# Patient Record
Sex: Female | Born: 1945 | Race: White | Marital: Single | Smoking: Never smoker
Health system: Southern US, Community
[De-identification: ages and names within clinical notes are randomized; demographics above are authoritative.]

## PROBLEM LIST (undated history)

## (undated) DIAGNOSIS — M12811 Other specific arthropathies, not elsewhere classified, right shoulder: Secondary | ICD-10-CM

## (undated) DIAGNOSIS — M25511 Pain in right shoulder: Secondary | ICD-10-CM

## (undated) DIAGNOSIS — M1990 Unspecified osteoarthritis, unspecified site: Secondary | ICD-10-CM

## (undated) DIAGNOSIS — J45909 Unspecified asthma, uncomplicated: Secondary | ICD-10-CM

## (undated) DIAGNOSIS — M75100 Unspecified rotator cuff tear or rupture of unspecified shoulder, not specified as traumatic: Secondary | ICD-10-CM

## (undated) DIAGNOSIS — I10 Essential (primary) hypertension: Secondary | ICD-10-CM

## (undated) DIAGNOSIS — G8929 Other chronic pain: Secondary | ICD-10-CM

## (undated) DIAGNOSIS — S4991XA Unspecified injury of right shoulder and upper arm, initial encounter: Secondary | ICD-10-CM

## (undated) HISTORY — PX: CHOLECYSTECTOMY: SHX55

## (undated) HISTORY — PX: ABDOMINAL HYSTERECTOMY: SHX81

---

## 2002-03-08 HISTORY — PX: ROTATOR CUFF REPAIR: SHX139

## 2017-11-06 DIAGNOSIS — S4991XA Unspecified injury of right shoulder and upper arm, initial encounter: Secondary | ICD-10-CM

## 2017-11-06 HISTORY — DX: Unspecified injury of right shoulder and upper arm, initial encounter: S49.91XA

## 2018-01-19 ENCOUNTER — Ambulatory Visit (HOSPITAL_COMMUNITY)
Admission: RE | Admit: 2018-01-19 | Discharge: 2018-01-19 | Disposition: A | Discharge status: Home / Self Care | Payer: Medicare Other | Source: Ambulatory Visit | Attending: Adult Health | Admitting: Adult Health

## 2018-01-19 ENCOUNTER — Other Ambulatory Visit (HOSPITAL_COMMUNITY): Payer: Self-pay | Admitting: General Practice

## 2018-01-19 DIAGNOSIS — M25511 Pain in right shoulder: Secondary | ICD-10-CM

## 2018-01-19 DIAGNOSIS — M19011 Primary osteoarthritis, right shoulder: Secondary | ICD-10-CM | POA: Insufficient documentation

## 2018-01-28 ENCOUNTER — Emergency Department (HOSPITAL_COMMUNITY)
Admission: EM | Admit: 2018-01-28 | Discharge: 2018-01-28 | Disposition: A | Discharge status: Home / Self Care | Payer: Medicare Other | Attending: Emergency Medicine | Admitting: Emergency Medicine

## 2018-01-28 ENCOUNTER — Encounter (HOSPITAL_COMMUNITY): Payer: Self-pay | Admitting: Emergency Medicine

## 2018-01-28 ENCOUNTER — Other Ambulatory Visit: Payer: Self-pay

## 2018-01-28 DIAGNOSIS — J45909 Unspecified asthma, uncomplicated: Secondary | ICD-10-CM | POA: Insufficient documentation

## 2018-01-28 DIAGNOSIS — I10 Essential (primary) hypertension: Secondary | ICD-10-CM | POA: Diagnosis not present

## 2018-01-28 DIAGNOSIS — M19011 Primary osteoarthritis, right shoulder: Secondary | ICD-10-CM | POA: Diagnosis not present

## 2018-01-28 DIAGNOSIS — Z79899 Other long term (current) drug therapy: Secondary | ICD-10-CM | POA: Insufficient documentation

## 2018-01-28 DIAGNOSIS — M25511 Pain in right shoulder: Secondary | ICD-10-CM | POA: Diagnosis present

## 2018-01-28 HISTORY — DX: Unspecified osteoarthritis, unspecified site: M19.90

## 2018-01-28 HISTORY — DX: Essential (primary) hypertension: I10

## 2018-01-28 HISTORY — DX: Unspecified asthma, uncomplicated: J45.909

## 2018-01-28 MED ORDER — TRAMADOL HCL 50 MG PO TABS: ORAL_TABLET | ORAL | 0 refills | Status: DC

## 2018-01-28 MED ORDER — DEXAMETHASONE SODIUM PHOSPHATE 10 MG/ML IJ SOLN
10.0000 mg | Freq: Once | INTRAMUSCULAR | Status: AC
  Administered 2018-01-28: 10 mg via INTRAMUSCULAR
  Filled 2018-01-28: qty 1

## 2018-01-28 NOTE — ED Provider Notes (Signed)
[facility] EMERGENCY DEPARTMENT Provider Note   CSN: [id number] Arrival date & time: 01/28/18  1256     History   Chief Complaint Chief Complaint  Patient presents with  . Shoulder Pain    HPI Veronica Stark is a 72 y.o. female.  Patient is a 72 year old female who presents to the emergency department with a complaint of right shoulder pain.  The patient states that several years ago she had a rotator cuff repair.  Approximately a month ago she injured her shoulder by reaching in the back of her car to get an object.  She had a pop and pain sensation.  She is been seen by her primary physician, has been given some steroid medication and an appointment is being made for her to see an orthopedic specialist.  Patient states that the pain is getting worse instead of better and at times it seems to go up from her shoulder into her neck area.  She was concerned because the pain was getting worse instead of better.  It is also noted that the patient was previously seeing a pain management specialist, and is not seeing a pain management specialist at this time.  She request evaluation and management of her issues.  The history is provided by the patient.    Past Medical History:  Diagnosis Date  . Arthritis   . Asthma   . Hypertension     There are no active problems to display for this patient.   Past Surgical History:  Procedure Laterality Date  . ABDOMINAL HYSTERECTOMY    . CHOLECYSTECTOMY    . ROTATOR CUFF REPAIR Right      OB History   None      Home Medications    Prior to Admission medications   Medication Sig Start Date End Date Taking? Authorizing Provider  ATORVASTATIN CALCIUM PO Take by mouth.   Yes [provider]  Celecoxib (CELEBREX PO) Take by mouth.   Yes [provider]  levothyroxine (SYNTHROID, LEVOTHROID) 75 MCG tablet Take 75 mcg by mouth daily before breakfast.   Yes [provider]  LISINOPRIL PO Take by mouth.    Yes [provider]    Family History No family history on file.  Social History Social History   Tobacco Use  . Smoking status: Never Smoker  . Smokeless tobacco: Never Used  Substance Use Topics  . Alcohol use: Not Currently  . Drug use: Never     Allergies   Patient has no known allergies.   Review of Systems Review of Systems  Constitutional: Negative for activity change.       All ROS Neg except as noted in HPI  HENT: Negative for nosebleeds.   Eyes: Negative for photophobia and discharge.  Respiratory: Negative for cough, shortness of breath and wheezing.   Cardiovascular: Negative for chest pain and palpitations.  Gastrointestinal: Negative for abdominal pain and blood in stool.  Genitourinary: Negative for dysuria, frequency and hematuria.  Musculoskeletal: Positive for arthralgias. Negative for back pain and neck pain.  Skin: Negative.   Neurological: Negative for dizziness, seizures and speech difficulty.  Psychiatric/Behavioral: Negative for confusion and hallucinations.     Physical Exam Updated Vital Signs BP (!) 202/85 (BP Location: Left Arm)   Pulse 75   Temp (!) 97.5 F (36.4 C) (Oral)   Resp 18   Ht 5\' 2"  (1.575 m)   Wt 66.7 kg   SpO2 100%   BMI 26.89 kg/m  Physical Exam  Constitutional: She is oriented to person, place, and time. She appears well-developed and well-nourished.  Non-toxic appearance.  HENT:  Head: Normocephalic.  Right Ear: Tympanic membrane and external ear normal.  Left Ear: Tympanic membrane and external ear normal.  Eyes: Pupils are equal, round, and reactive to light. EOM and lids are normal.  Neck: Normal range of motion. Neck supple. Carotid bruit is not present.  Cardiovascular: Normal rate, regular rhythm, normal heart sounds, intact distal pulses and normal pulses.  Pulmonary/Chest: Breath sounds normal. No respiratory distress.  Abdominal: Soft. Bowel sounds are normal. There is no tenderness. There is  no guarding.  Musculoskeletal: Normal range of motion.  There is a resolving bruise at the [facility] joint portion of the right shoulder.  There is pain with attempted range of motion of the shoulder.  There is pain with palpation just above the clavicle.  The scapula is nondisplaced.  The brachial and radial pulses are 2+.  There is no deformity or evidence of any dislocation or fracture at this time.  Lymphadenopathy:       Head (right side): No submandibular adenopathy present.       Head (left side): No submandibular adenopathy present.    She has no cervical adenopathy.  Neurological: She is alert and oriented to person, place, and time. She has normal strength. No cranial nerve deficit or sensory deficit.  Skin: Skin is warm and dry.  Psychiatric: She has a normal mood and affect. Her speech is normal.  Nursing note and vitals reviewed.    ED Treatments / Results  Labs (all labs ordered are listed, but only abnormal results are displayed) Labs Reviewed - No data to display  EKG None  Radiology No results found.  Procedures Procedures (including critical care time)  Medications Ordered in ED Medications - No data to display   Initial Impression / Assessment and Plan / ED Course  I have reviewed the triage vital signs and the nursing notes.  Pertinent labs & imaging results that were available during my care of the patient were reviewed by me and considered in my medical decision making (see chart for details).  Clinical Course as of Jan 28 1525  Sat Jan 28, 2018  1455 Female complaining of right shoulder pain is been going on for a few months.  She reaggravated it reaching behind her for something and since then she has had pain.  She is newly moved here from [place] and does not have an orthopedic doctor or pain management doctor.  More recently there is been some new bruising over the anterior part of the shoulder.  It is diffusely tender.  She had x-rays just a few days ago  that were negative ordered by her new PCP.  She likely has some tear in her rotator cuff and will need to follow-up with orthopedics.   [MB]    Clinical Course User Index [MB] Terrilee FilesButler, Michael C, MD      Final Clinical Impressions(s) / ED Diagnoses MDM  Patient seen with me by Dr. Charm BargesButler.  Vital signs reviewed.  Blood pressure is elevated at 169/89, otherwise within normal limits.  No neurovascular deficits appreciated on the examination at this time.  The examination supports exacerbation of the arthritis changes and the chronic pain from previous rotator cuff repair.  No neurovascular deficits noted.  Patient is fitted with a sling, and is asked to continue her current medications.  Patient given an injection of Decadron here in  the emergency department.  Prescription for Ultram given to the patient to use every 6 hours.  Patient is to follow-up with orthopedics as previously ordered and suggested.   Final diagnoses:  Primary osteoarthritis of right shoulder    ED Discharge Orders         Ordered    traMADol (ULTRAM) 50 MG tablet     01/28/18 1523           Ivery Quale, PA-C 01/28/18 1532    Terrilee Files, MD 01/28/18 1739

## 2018-01-28 NOTE — Discharge Instructions (Addendum)
Your vital signs are within normal limits with exception of your blood pressure being slightly elevated at 169/89.  I have reviewed your previous x-ray.  It shows arthritis changes, but no fracture, no dislocation.  Your examination suggest exacerbation of arthritis.  No neurologic or vascular changes appreciated on today's examination.  Please use the sling to assist with your discomfort.  Please continue your current medications.  Please add Ultram every 6 hours if needed for more severe pain.  Please keep your appointment with the orthopedic specialist as scheduled.

## 2018-01-28 NOTE — ED Triage Notes (Signed)
Pt report she injured her right should about a month ago, went to see her MD was was getting referred to ortho but hasn't been there yet. Taking prednisone and aleve OTC with mild relief.

## 2018-01-30 ENCOUNTER — Encounter: Payer: Self-pay | Admitting: Gastroenterology

## 2018-02-07 ENCOUNTER — Ambulatory Visit (INDEPENDENT_AMBULATORY_CARE_PROVIDER_SITE_OTHER): Payer: Medicare Other | Admitting: Orthopaedic Surgery

## 2018-02-07 ENCOUNTER — Encounter: Payer: Self-pay | Admitting: Orthopaedic Surgery

## 2018-02-07 VITALS — BP 183/103 | HR 83 | Ht 64.0 in | Wt 148.0 lb

## 2018-02-07 DIAGNOSIS — M25511 Pain in right shoulder: Secondary | ICD-10-CM

## 2018-02-07 DIAGNOSIS — G8929 Other chronic pain: Secondary | ICD-10-CM | POA: Diagnosis not present

## 2018-02-07 MED ORDER — HYDROCODONE-ACETAMINOPHEN 5-325 MG PO TABS: ORAL_TABLET | ORAL | 0 refills | Status: DC

## 2018-02-07 NOTE — Addendum Note (Signed)
Addended by: Baird KayUGLAS, Sitlaly Gudiel M on: 02/07/2018 02:06 PM   Modules accepted: Orders

## 2018-02-07 NOTE — Progress Notes (Signed)
Subjective:    Patient ID: Veronica Stark, female    DOB: 09-29-1945, 72 y.o.   MRN: [id number]  HPI Three months ago she reached to the back seat of her car to grab a charger for her phone and felt pain in the right shoulder.  It has been hurting since then.  She has had increasing pain and tenderness in the shoulder.  She has no direct trauma.  She developed swelling and bruising of the right shoulder about three weeks ago and it has not gone away.  She has seen her local doctor here several times.  She has been to the ER.  I have reviewed the notes from the ER and her doctor.  I have reviewed the x-rays.  She had surgery on the right shoulder in 2004 for rotator cuff repair (two anchors are present) and did well until three months ago.  She has no redness, no numbness.  She has pain sleeping and using her right arm.  She has been on Tramadol which did not help much.   Review of Systems  Constitutional: Positive for activity change.  Respiratory: Positive for shortness of breath. Negative for cough.   Musculoskeletal: Positive for arthralgias and joint swelling.  All other systems reviewed and are negative.  For Review of Systems, all other systems reviewed and are negative.  The following is a summary of the past history medically, past history surgically, known current medicines, social history and family history.  This information is gathered electronically by the computer from prior information and documentation.  I review this each visit and have found including this information at this point in the chart is beneficial and informative.   Past Medical History:  Diagnosis Date  . Arthritis   . Asthma   . Hypertension     Past Surgical History:  Procedure Laterality Date  . ABDOMINAL HYSTERECTOMY    . CHOLECYSTECTOMY    . ROTATOR CUFF REPAIR Right     Current Outpatient Medications on File Prior to Visit  Medication Sig Dispense Refill  . ATORVASTATIN CALCIUM PO Take by  mouth.    . Celecoxib (CELEBREX PO) Take by mouth.    . levothyroxine (SYNTHROID, LEVOTHROID) 75 MCG tablet Take 75 mcg by mouth daily before breakfast.    . LISINOPRIL PO Take by mouth.    . traMADol (ULTRAM) 50 MG tablet 1 or 2 po q6h prn pain 12 tablet 0   No current facility-administered medications on file prior to visit.     Social History   Socioeconomic History  . Marital status: Single    Spouse name: Not on file  . Number of children: Not on file  . Years of education: Not on file  . Highest education level: Not on file  Occupational History  . Not on file  Social Needs  . Financial resource strain: Not on file  . Food insecurity:    Worry: Not on file    Inability: Not on file  . Transportation needs:    Medical: Not on file    Non-medical: Not on file  Tobacco Use  . Smoking status: Never Smoker  . Smokeless tobacco: Never Used  Substance and Sexual Activity  . Alcohol use: Not Currently  . Drug use: Never  . Sexual activity: Not on file  Lifestyle  . Physical activity:    Days per week: Not on file    Minutes per session: Not on file  . Stress: Not on file  Relationships  . Social connections:    Talks on phone: Not on file    Gets together: Not on file    Attends religious service: Not on file    Active member of club or organization: Not on file    Attends meetings of clubs or organizations: Not on file    Relationship status: Not on file  . Intimate partner violence:    Fear of current or ex partner: Not on file    Emotionally abused: Not on file    Physically abused: Not on file    Forced sexual activity: Not on file  Other Topics Concern  . Not on file  Social History Narrative  . Not on file    Family History  Problem Relation Age of Onset  . Kidney disease Mother   . Heart disease Maternal Grandfather     BP (!) 183/103   Pulse 83   Ht 5\' 4"  (1.626 m)   Wt 148 lb (67.1 kg)   BMI 25.40 kg/m   Body mass index is 25.4 kg/m.      Objective:   Physical Exam  Constitutional: She is oriented to person, place, and time. She appears well-developed and well-nourished.  HENT:  Head: Normocephalic and atraumatic.  Eyes: Pupils are equal, round, and reactive to light. Conjunctivae and EOM are normal.  Neck: Normal range of motion. Neck supple.  Cardiovascular: Normal rate, regular rhythm and intact distal pulses.  Pulmonary/Chest: Effort normal.  Abdominal: Soft.  Musculoskeletal:       Arms: Neurological: She is alert and oriented to person, place, and time. She has normal reflexes. She displays normal reflexes. No cranial nerve deficit. She exhibits normal muscle tone. Coordination normal.  Skin: Skin is warm and dry.  Psychiatric: She has a normal mood and affect. Her behavior is normal. Judgment and thought content normal.          Assessment & Plan:   Encounter Diagnosis  Name Primary?  . Chronic right shoulder pain Yes   I will order a MRI of the right shoulder. I feel she has a new tear.  Return after the MRI.  I will give pain medicine.  I have reviewed the [place] Controlled Substance Reporting System web site prior to prescribing narcotic medicine for this patient.   Electronically Signed Darreld McleanWayne Tenise Stetler, MD 12/3/20192:04 PM

## 2018-02-15 ENCOUNTER — Ambulatory Visit (HOSPITAL_COMMUNITY)
Admission: RE | Admit: 2018-02-15 | Discharge: 2018-02-15 | Disposition: A | Discharge status: Home / Self Care | Payer: Medicare Other | Source: Ambulatory Visit | Attending: Orthopaedic Surgery | Admitting: Orthopaedic Surgery

## 2018-02-15 DIAGNOSIS — M25411 Effusion, right shoulder: Secondary | ICD-10-CM | POA: Insufficient documentation

## 2018-02-15 DIAGNOSIS — G8929 Other chronic pain: Secondary | ICD-10-CM | POA: Diagnosis not present

## 2018-02-15 DIAGNOSIS — M25511 Pain in right shoulder: Secondary | ICD-10-CM | POA: Insufficient documentation

## 2018-02-15 DIAGNOSIS — M75121 Complete rotator cuff tear or rupture of right shoulder, not specified as traumatic: Secondary | ICD-10-CM | POA: Diagnosis not present

## 2018-02-15 DIAGNOSIS — Z9889 Other specified postprocedural states: Secondary | ICD-10-CM | POA: Diagnosis not present

## 2018-02-16 ENCOUNTER — Ambulatory Visit (INDEPENDENT_AMBULATORY_CARE_PROVIDER_SITE_OTHER): Payer: Medicare Other | Admitting: Orthopaedic Surgery

## 2018-02-16 ENCOUNTER — Encounter: Payer: Self-pay | Admitting: Orthopaedic Surgery

## 2018-02-16 ENCOUNTER — Telehealth: Payer: Self-pay | Admitting: Orthopaedic Surgery

## 2018-02-16 ENCOUNTER — Telehealth: Payer: Self-pay | Admitting: Radiology

## 2018-02-16 VITALS — BP 163/82 | HR 76 | Ht 64.0 in | Wt 148.0 lb

## 2018-02-16 DIAGNOSIS — M25511 Pain in right shoulder: Secondary | ICD-10-CM | POA: Diagnosis not present

## 2018-02-16 DIAGNOSIS — G8929 Other chronic pain: Secondary | ICD-10-CM

## 2018-02-16 MED ORDER — HYDROCODONE-ACETAMINOPHEN 5-325 MG PO TABS: ORAL_TABLET | ORAL | 0 refills | Status: DC

## 2018-02-16 NOTE — Telephone Encounter (Signed)
-----   Message from Vickki HearingStanley E Harrison, MD sent at 02/16/2018  2:05 PM EST ----- Regarding: RE: patient ? Dr. August Saucerean is much better equipped to handle this then I am looks like a potential superior capsular reconstruction or reverse shoulder replacement ----- Message ----- From: Caffie DammeLittrell, Amy W, RT Sent: 02/16/2018   9:23 AM EST To: Vickki HearingStanley E Harrison, MD Subject: patient ?                                       The patient is status post rotator cuff repair. The supraspinatus is completely torn and retracted 2.5-3.5 cm with mild to moderate atrophy of both the supraspinatus and infraspinatus muscle bellies.  Complete tear of the long head of biceps from the superior labrum versus biceps tenotomy. Extensive fluid is seen tracking into the biceps tendon sheath anterior to the humerus.  Large glenohumeral joint effusion with findings compatible with nodular synovitis in the joint. Largest single focus of synovium is seen in the subscapularis recess anterior to the subscapularis muscle and tendon.  Status post acromioplasty and debridement of the [facility] joint.  Large subcutaneous fluid collection over the [facility] joint and acromion likely represents extension of joint fluid through the patient's rotator cuff tear and acromioclavicular joint into the subcutaneous soft tissues but could be a ganglion or sebaceous cyst.Is this something you can take care of, or would you like for Dr Hilda LiasKeeling to [facility] [place] August Saucerean?

## 2018-02-16 NOTE — Telephone Encounter (Signed)
Hydrocodone-Acetaminophen  5/325 mg  Qty 30 Tablets  PATIENT USES [facility] [place]

## 2018-02-16 NOTE — Telephone Encounter (Signed)
Called patient to advise referral sent to Dr August Saucerean and they will call her with appointment

## 2018-02-16 NOTE — Progress Notes (Signed)
Patient Veronica Stark, female DOB:1945-04-27, 72 y.o. AVW:[id number]  Chief Complaint  Patient presents with  . Shoulder Pain    right   . Results    review MRI    HPI  Veronica Stark is a 72 y.o. female who has right shoulder pain.  She had a MRI which showed: IMPRESSION: Motion degraded examination.  The patient is status post rotator cuff repair. The supraspinatus is completely torn and retracted 2.5-3.5 cm with mild to moderate atrophy of both the supraspinatus and infraspinatus muscle bellies.  Complete tear of the long head of biceps from the superior labrum versus biceps tenotomy. Extensive fluid is seen tracking into the biceps tendon sheath anterior to the humerus.  Large glenohumeral joint effusion with findings compatible with nodular synovitis in the joint. Largest single focus of synovium is seen in the subscapularis recess anterior to the subscapularis muscle and tendon.  Status post acromioplasty and debridement of the [facility] joint.  Large subcutaneous fluid collection over the [facility] joint and acromion likely represents extension of joint fluid through the patient's rotator cuff tear and acromioclavicular joint into the subcutaneous soft tissues but could be a ganglion or sebaceous cyst.  I have explained the findings to her.  I told her I do not do surgery anymore.  I will refer her for surgery.   Body mass index is 25.4 kg/m.  ROS  Review of Systems  Constitutional: Positive for activity change.  Respiratory: Positive for shortness of breath. Negative for cough.   Musculoskeletal: Positive for arthralgias and joint swelling.  All other systems reviewed and are negative.   All other systems reviewed and are negative.  The following is a summary of the past history medically, past history surgically, known current medicines, social history and family history.  This information is gathered electronically by the computer from prior information  and documentation.  I review this each visit and have found including this information at this point in the chart is beneficial and informative.    Past Medical History:  Diagnosis Date  . Arthritis   . Asthma   . Hypertension     Past Surgical History:  Procedure Laterality Date  . ABDOMINAL HYSTERECTOMY    . CHOLECYSTECTOMY    . ROTATOR CUFF REPAIR Right     Family History  Problem Relation Age of Onset  . Kidney disease Mother   . Heart disease Maternal Grandfather     Social History Social History   Tobacco Use  . Smoking status: Never Smoker  . Smokeless tobacco: Never Used  Substance Use Topics  . Alcohol use: Not Currently  . Drug use: Never    No Known Allergies  Current Outpatient Medications  Medication Sig Dispense Refill  . atorvastatin (LIPITOR) 20 MG tablet Take 20 mg by mouth every evening.  0  . celecoxib (CELEBREX) 50 MG capsule     . HYDROcodone-acetaminophen (NORCO/VICODIN) 5-325 MG tablet One tablet every four hours for pain. 30 tablet 0  . levothyroxine (SYNTHROID, LEVOTHROID) 75 MCG tablet Take 75 mcg by mouth daily before breakfast.    . LISINOPRIL PO Take by mouth.    . leflunomide (ARAVA) 20 MG tablet Take 20 mg by mouth daily.  0   No current facility-administered medications for this visit.      Physical Exam  Blood pressure (!) 163/82, pulse 76, height 5\' 4"  (1.626 m), weight 148 lb (67.1 kg).  Constitutional: overall normal hygiene, normal nutrition, well developed, normal grooming, normal  body habitus. Assistive device: sling right.  Musculoskeletal: gait and station Limp none, muscle tone and strength are normal, no tremors or atrophy is present.  .  Neurological: coordination overall normal.  Deep tendon reflex/nerve stretch intact.  Sensation normal.  Cranial nerves II-XII intact.   Skin:   Normal overall no scars, lesions, ulcers or rashes. No psoriasis.  Psychiatric: Alert and oriented x 3.  Recent memory intact, remote  memory unclear.  Normal mood and affect. Well groomed.  Good eye contact.  Cardiovascular: overall no swelling, no varicosities, no edema bilaterally, normal temperatures of the legs and arms, no clubbing, cyanosis and good capillary refill.  Lymphatic: palpation is normal.  Right shoulder is painful.  She has limited motion.  NV intact. All other systems reviewed and are negative   The patient has been educated about the nature of the problem(s) and counseled on treatment options.  The patient appeared to understand what I have discussed and is in agreement with it.  Encounter Diagnosis  Name Primary?  . Chronic right shoulder pain Yes    PLAN Call if any problems.  Precautions discussed.  Continue current medications.   Return to clinic for surgery referral   Electronically Signed Darreld McleanWayne Noela Brothers, MD 12/12/20199:31 AM

## 2018-02-21 ENCOUNTER — Telehealth: Payer: Self-pay | Admitting: Orthopaedic Surgery

## 2018-02-21 MED ORDER — HYDROCODONE-ACETAMINOPHEN 5-325 MG PO TABS: ORAL_TABLET | ORAL | 0 refills | Status: AC

## 2018-02-21 NOTE — Telephone Encounter (Signed)
Hydrocodone-Acetaminophen 5/325mg  Qty 30 Tablets  PATIENT USES [facility] [place] 

## 2018-03-09 ENCOUNTER — Telehealth: Payer: Self-pay | Admitting: Orthopaedic Surgery

## 2018-03-09 NOTE — Telephone Encounter (Signed)
Hydrocodone-Acetaminophen  5/325 mg  Qty 22 Tablets  PATIENT USES [place] [facility]  Patient states she has an appointment to see Dr. August Saucer on 03/13/18

## 2018-03-09 NOTE — Telephone Encounter (Signed)
No more narcotics 

## 2018-03-13 ENCOUNTER — Ambulatory Visit (INDEPENDENT_AMBULATORY_CARE_PROVIDER_SITE_OTHER): Payer: Medicare Other | Admitting: Orthopedic Surgery

## 2018-03-13 ENCOUNTER — Encounter (INDEPENDENT_AMBULATORY_CARE_PROVIDER_SITE_OTHER): Payer: Self-pay | Admitting: Orthopedic Surgery

## 2018-03-13 DIAGNOSIS — M25511 Pain in right shoulder: Secondary | ICD-10-CM | POA: Diagnosis not present

## 2018-03-13 DIAGNOSIS — M12811 Other specific arthropathies, not elsewhere classified, right shoulder: Secondary | ICD-10-CM

## 2018-03-13 MED ORDER — HYDROCODONE-ACETAMINOPHEN 5-325 MG PO TABS: 1.0000 | ORAL_TABLET | Freq: Two times a day (BID) | ORAL | 0 refills | Status: DC

## 2018-03-15 ENCOUNTER — Encounter (INDEPENDENT_AMBULATORY_CARE_PROVIDER_SITE_OTHER): Payer: Self-pay | Admitting: Orthopedic Surgery

## 2018-03-15 DIAGNOSIS — M25511 Pain in right shoulder: Secondary | ICD-10-CM

## 2018-03-15 DIAGNOSIS — M12811 Other specific arthropathies, not elsewhere classified, right shoulder: Secondary | ICD-10-CM

## 2018-03-15 MED ORDER — LIDOCAINE HCL 1 % IJ SOLN
5.0000 mL | INTRAMUSCULAR | Status: AC | PRN
  Administered 2018-03-15: 5 mL

## 2018-03-15 NOTE — Progress Notes (Signed)
Office Visit Note   Patient: Veronica Stark           Date of Birth: 1945-12-13           MRN: [id number] Visit Date: 03/13/2018 Requested by: Darreld Mclean, MD [address] [place], [place] [address] PCP: Pearson Grippe, MD  Subjective: Chief Complaint  Patient presents with  . Right Shoulder - Pain    HPI: Veronica Stark is a patient 73 years old with right shoulder pain.  She had rotator cuff repair done in 2004.  She was doing some moving in September and developed a pop and had pain.  She had subsequent swelling in the right shoulder.  She is right-hand dominant.  She has a history of being in pain management.  She has had neck shots which have helped her overall symptoms.  She does take Celebrex because she has a history of acid reflux.  She states that she may be starting Enbrel for osteoarthritis.  She denies a history of discrete rheumatoid arthritis.  She is also been taking tramadol.  MRI scan shows a retracted rotator cuff tear measuring approximately 3-1/2 cm.  She also has significant swelling and cystic changes              ROS: All systems reviewed are negative as they relate to the chief complaint within the history of present illness.  Patient denies  fevers or chills.   Assessment & Plan: Visit Diagnoses:  1. Rotator cuff arthropathy of right shoulder     Plan: Impression is significant swelling in the [facility] joint and in the shoulder as well as retracted rotator cuff tear.  Aspiration of that cyst is performed today and we obtained about 25 cc fluid.  This was blood-tinged and bloody fluid.  I think that decompression of the very large cyst on the superior aspect of her shoulder may help.  Norco prescription written 1 time only.  4-week return and then we can assess further surgical intervention.  The rotator cuff tear does not look repairable.  Follow-Up Instructions: Return in about 4 weeks (around 04/10/2018).   Orders:  No orders of the defined types were placed in this  encounter.  Meds ordered this encounter  Medications  . HYDROcodone-acetaminophen (NORCO/VICODIN) 5-325 MG tablet    Sig: Take 1 tablet by mouth every 12 (twelve) hours.    Dispense:  30 tablet    Refill:  0      Procedures: Large Joint Inj: R glenohumeral on 03/15/2018 10:43 PM Indications: diagnostic evaluation and pain Details: 18 G 1.5 in needle, posterior approach  Arthrogram: No  Medications: 5 mL lidocaine 1 % Aspirate: 25 mL bloody Outcome: tolerated well, no immediate complications Procedure, treatment alternatives, risks and benefits explained, specific risks discussed. Consent was given by the patient. Immediately prior to procedure a time out was called to verify the correct patient, procedure, equipment, support staff and site/side marked as required. Patient was prepped and draped in the usual sterile fashion.       Clinical Data: No additional findings.  Objective: Vital Signs: There were no vitals taken for this visit.  Physical Exam:   Constitutional: Patient appears well-developed HEENT:  Head: Normocephalic Eyes:EOM are normal Neck: Normal range of motion Cardiovascular: Normal rate Pulmonary/chest: Effort normal Neurologic: Patient is alert Skin: Skin is warm Psychiatric: Patient has normal mood and affect    Ortho Exam: Ortho exam demonstrates restricted active and passive range of motion of the shoulder to well  below 90 degrees of forward flexion abduction.  Rotator cuff strength predictably weak.  She does have about a 5 x 5 cm cystic mass on the superior aspect of her shoulder.  She cannot wear the bra strap over the shoulder.  Motor sensory function of the hand is intact no other masses lymphadenopathy or skin changes noted in that shoulder girdle region she does have pretty reasonable passive range of motion as well as some coarse grinding and crepitus with passive range of motion.  Specialty Comments:  No specialty comments  available.  Imaging: No results found.   PMFS History: There are no active problems to display for this patient.  Past Medical History:  Diagnosis Date  . Arthritis   . Asthma   . Hypertension     Family History  Problem Relation Age of Onset  . Kidney disease Mother   . Heart disease Maternal Grandfather     Past Surgical History:  Procedure Laterality Date  . ABDOMINAL HYSTERECTOMY    . CHOLECYSTECTOMY    . ROTATOR CUFF REPAIR Right    Social History   Occupational History  . Not on file  Tobacco Use  . Smoking status: Never Smoker  . Smokeless tobacco: Never Used  Substance and Sexual Activity  . Alcohol use: Not Currently  . Drug use: Never  . Sexual activity: Not on file

## 2018-03-27 ENCOUNTER — Encounter (INDEPENDENT_AMBULATORY_CARE_PROVIDER_SITE_OTHER): Payer: Self-pay | Admitting: Orthopedic Surgery

## 2018-03-27 ENCOUNTER — Ambulatory Visit (INDEPENDENT_AMBULATORY_CARE_PROVIDER_SITE_OTHER): Payer: Medicare Other | Admitting: Orthopedic Surgery

## 2018-03-27 VITALS — Ht 64.5 in | Wt 150.0 lb

## 2018-03-27 DIAGNOSIS — M12811 Other specific arthropathies, not elsewhere classified, right shoulder: Secondary | ICD-10-CM | POA: Diagnosis not present

## 2018-03-27 MED ORDER — HYDROCODONE-ACETAMINOPHEN 5-325 MG PO TABS: 1.0000 | ORAL_TABLET | Freq: Two times a day (BID) | ORAL | 0 refills | Status: AC

## 2018-03-27 NOTE — Progress Notes (Signed)
Office Visit Note   Patient: Veronica Stark           Date of Birth: 05-13-1945           MRN: [id number] Visit Date: 03/27/2018 Requested by: Pearson Grippe, MD [address] [place], [place] [address] PCP: Pearson Grippe, MD  Subjective: Chief Complaint  Patient presents with  . Right Shoulder - Pain, Follow-up    HPI: [name] is a patient with right shoulder rotator cuff arthropathy following a fall.  Had rotator cuff repair in 2004.  MRI scan demonstrates large effusion in the joint with irreparable rotator cuff tearing and large cyst coming from the [facility] joint.  Cyst was aspirated last clinic visit.  In general she is more functional with that right shoulder but she still has pain after the cyst recurred which was about 3 days after aspiration.  She did have function that she could live with while the cyst was decompressed.  Now is reporting recurrent pain and difficulty sleeping.  She is on hold for taking Enbrel for systemic arthritis.              ROS: All systems reviewed are negative as they relate to the chief complaint within the history of present illness.  Patient denies  fevers or chills.   Assessment & Plan: Visit Diagnoses:  1. Rotator cuff arthropathy of right shoulder     Plan: Impression is right shoulder rotator cuff arthropathy following a fall.  Plan is right shoulder cyst excision.  I think in general she is pretty functional although painful with that right shoulder.  I would favor a trial at the lesser procedure first before doing a reverse shoulder replacement.  Both options discussed today with her and her friend.  Patient understands the risk and benefits of both procedures.  I think there is about a 5 to 10% chance of cyst recurrence particularly with the rotator cuff arthropathy and effusion is likely to continue with the right shoulder; however, she did have pretty good pain relief and has reasonable function at this time with that right arm except for the  cyst which is a mechanical block to motion and is causing some pain.  Plan is open cyst excision.  Risk and benefits are discussed.  If this does not help with enough of the pain then we can revisit the issue of reverse shoulder replacement down the road.  Follow-Up Instructions: No follow-ups on file.   Orders:  No orders of the defined types were placed in this encounter.  Meds ordered this encounter  Medications  . HYDROcodone-acetaminophen (NORCO/VICODIN) 5-325 MG tablet    Sig: Take 1 tablet by mouth every 12 (twelve) hours.    Dispense:  30 tablet    Refill:  0      Procedures: No procedures performed   Clinical Data: No additional findings.  Objective: Vital Signs: Ht 5' 4.5" (1.638 m)   Wt 150 lb (68 kg)   BMI 25.35 kg/m   Physical Exam:   Constitutional: Patient appears well-developed HEENT:  Head: Normocephalic Eyes:EOM are normal Neck: Normal range of motion Cardiovascular: Normal rate Pulmonary/chest: Effort normal Neurologic: Patient is alert Skin: Skin is warm Psychiatric: Patient has normal mood and affect    Ortho Exam: Ortho exam demonstrates full active and passive range of motion of the cervical spine.  She actually has forward flexion abduction of that right shoulder both above 90 degrees but has recurrence of the fluid within  that cyst and the cyst measures about 7 x 5 x 3-1/2 cm.  It is tense and tender to palpation but does not appear to be infected.  No erythema or warmth.  Rotator cuff exam unchanged from prior visit.  Specialty Comments:  No specialty comments available.  Imaging: No results found.   PMFS History: There are no active problems to display for this patient.  Past Medical History:  Diagnosis Date  . Arthritis   . Asthma   . Hypertension     Family History  Problem Relation Age of Onset  . Kidney disease Mother   . Heart disease Maternal Grandfather     Past Surgical History:  Procedure Laterality Date  .  ABDOMINAL HYSTERECTOMY    . CHOLECYSTECTOMY    . ROTATOR CUFF REPAIR Right    Social History   Occupational History  . Not on file  Tobacco Use  . Smoking status: Never Smoker  . Smokeless tobacco: Never Used  Substance and Sexual Activity  . Alcohol use: Not Currently  . Drug use: Never  . Sexual activity: Not on file

## 2018-04-03 ENCOUNTER — Telehealth (INDEPENDENT_AMBULATORY_CARE_PROVIDER_SITE_OTHER): Payer: Self-pay | Admitting: Radiology

## 2018-04-03 ENCOUNTER — Telehealth (INDEPENDENT_AMBULATORY_CARE_PROVIDER_SITE_OTHER): Payer: Self-pay | Admitting: Orthopedic Surgery

## 2018-04-03 ENCOUNTER — Emergency Department (HOSPITAL_COMMUNITY): Payer: Medicare Other

## 2018-04-03 ENCOUNTER — Emergency Department (HOSPITAL_COMMUNITY)
Admission: EM | Admit: 2018-04-03 | Discharge: 2018-04-03 | Disposition: A | Discharge status: Home / Self Care | Payer: Medicare Other | Attending: Emergency Medicine | Admitting: Emergency Medicine

## 2018-04-03 ENCOUNTER — Encounter (HOSPITAL_COMMUNITY): Payer: Self-pay | Admitting: Emergency Medicine

## 2018-04-03 ENCOUNTER — Other Ambulatory Visit: Payer: Self-pay

## 2018-04-03 DIAGNOSIS — I10 Essential (primary) hypertension: Secondary | ICD-10-CM | POA: Diagnosis not present

## 2018-04-03 DIAGNOSIS — Z9049 Acquired absence of other specified parts of digestive tract: Secondary | ICD-10-CM | POA: Diagnosis not present

## 2018-04-03 DIAGNOSIS — M25511 Pain in right shoulder: Secondary | ICD-10-CM

## 2018-04-03 DIAGNOSIS — M129 Arthropathy, unspecified: Secondary | ICD-10-CM | POA: Insufficient documentation

## 2018-04-03 DIAGNOSIS — J45909 Unspecified asthma, uncomplicated: Secondary | ICD-10-CM | POA: Diagnosis not present

## 2018-04-03 DIAGNOSIS — M12811 Other specific arthropathies, not elsewhere classified, right shoulder: Secondary | ICD-10-CM

## 2018-04-03 DIAGNOSIS — Z79899 Other long term (current) drug therapy: Secondary | ICD-10-CM | POA: Diagnosis not present

## 2018-04-03 DIAGNOSIS — G8929 Other chronic pain: Secondary | ICD-10-CM

## 2018-04-03 HISTORY — DX: Other specific arthropathies, not elsewhere classified, right shoulder: M12.811

## 2018-04-03 HISTORY — DX: Unspecified injury of right shoulder and upper arm, initial encounter: S49.91XA

## 2018-04-03 HISTORY — DX: Other chronic pain: G89.29

## 2018-04-03 HISTORY — DX: Pain in right shoulder: M25.511

## 2018-04-03 HISTORY — DX: Unspecified rotator cuff tear or rupture of unspecified shoulder, not specified as traumatic: M75.100

## 2018-04-03 MED ORDER — METHOCARBAMOL 750 MG PO TABS: 750.0000 mg | ORAL_TABLET | Freq: Four times a day (QID) | ORAL | 0 refills | Status: AC

## 2018-04-03 MED ORDER — MORPHINE SULFATE (PF) 4 MG/ML IV SOLN
4.0000 mg | Freq: Once | INTRAVENOUS | Status: AC
  Administered 2018-04-03: 4 mg via INTRAMUSCULAR
  Filled 2018-04-03: qty 1

## 2018-04-03 MED ORDER — HYDROMORPHONE HCL 2 MG/ML IJ SOLN
2.0000 mg | Freq: Once | INTRAMUSCULAR | Status: AC
  Administered 2018-04-03: 2 mg via INTRAMUSCULAR
  Filled 2018-04-03: qty 1

## 2018-04-03 NOTE — Telephone Encounter (Signed)
Left message on patient's voice mail  [phone] regarding a surgery date. Feb 6th was an option for her surgery at [facility] late in afternoon, however there is an earlier date available on Feb 3rd at [facility].  Date is being held until contact is made with patient. She would be the first patient for the morning at 7:30am.

## 2018-04-03 NOTE — ED Triage Notes (Signed)
Patient reports severe shoulder pain. Injured in September, scheduled for surgery. Patient reached back in her car to get something and felt "a pop." Family reports patient has taken 3 pain pills this am and has not had any relief.

## 2018-04-03 NOTE — ED Provider Notes (Signed)
[facility] EMERGENCY DEPARTMENT Provider Note   CSN: [id number] Arrival date & time: 04/03/18  1310     History   Chief Complaint Chief Complaint  Patient presents with  . Shoulder Pain    HPI Lonia Skinneraula L Buzan is a 73 y.o. female.  HPI  Pt was seen at 1440. Per pt, c/o gradual onset and persistence of constant acute flair of her chronic right shoulder "pain" since overnight last night. Pt describes the pain as per her usual pain pattern since Sept 2019, when she "felt a pop" while she was moving her living arrangements. Pt states her Ortho MD "aspirated a cyst but it came back" several weeks ago, and she is due to have surgery on this. Pt has been taking her own pain meds (norco) this morning without relief. Denies new injury, no neck or back pain, no CP/SOB, no cough, no abd pain, no N/V/D, no focal motor weakness, no tingling/numbness in extremities, no fevers, no rash.    Past Medical History:  Diagnosis Date  . Arthritis   . Asthma   . Chronic right shoulder pain   . Hypertension   . Right shoulder injury 11/2017  . Rotator cuff arthropathy, right   . Rotator cuff tear    right    There are no active problems to display for this patient.   Past Surgical History:  Procedure Laterality Date  . ABDOMINAL HYSTERECTOMY    . CHOLECYSTECTOMY    . ROTATOR CUFF REPAIR Right 2004     OB History    Gravida  2   Para  2   Term  2   Preterm      AB      Living        SAB      TAB      Ectopic      Multiple      Live Births               Home Medications    Prior to Admission medications   Medication Sig Start Date End Date Taking? Authorizing Provider  Ascorbic Acid (VITAMIN C) 500 MG CHEW Chew 1 tablet by mouth daily.   Yes [provider]  atorvastatin (LIPITOR) 20 MG tablet Take 20 mg by mouth every evening. 01/18/18  Yes [provider]  Bacillus Coagulans-Inulin (PROBIOTIC FORMULA PO) Take 1 tablet by mouth daily.   Yes  [provider]  Biotin [id number] MCG TABS Take 2 tablets by mouth daily.   Yes [provider]  Calcium Citrate-Vitamin D (CALCIUM + D PO) Take 1 tablet by mouth daily.   Yes [provider]  celecoxib (CELEBREX) 50 MG capsule Take 50 mg by mouth 2 (two) times daily.  01/23/18  Yes [provider]  clonazePAM (KLONOPIN) 0.5 MG tablet Take 1 mg by mouth at bedtime.  03/05/18  Yes [provider]  HYDROcodone-acetaminophen (NORCO/VICODIN) 5-325 MG tablet Take 1 tablet by mouth every 12 (twelve) hours. Patient taking differently: Take 0.5-1 tablets by mouth every 12 (twelve) hours.  03/27/18  Yes Cammy Copaean, Gregory Scott, MD  levothyroxine (SYNTHROID, LEVOTHROID) 75 MCG tablet Take 75 mcg by mouth daily before breakfast.   Yes [provider]  lisinopril (PRINIVIL,ZESTRIL) 20 MG tablet Take 20 mg by mouth daily.  03/06/18  Yes [provider]  meloxicam (MOBIC) 7.5 MG tablet Take 7.5 mg by mouth daily as needed for pain.   Yes [provider]  Misc Natural Products (  TRIPLE FLEX) CAPS Take 3 capsules by mouth daily.   Yes [provider]  HYDROcodone-acetaminophen (NORCO/VICODIN) 5-325 MG tablet One tablet every four hours for pain. Patient not taking: Reported on 04/03/2018 02/21/18   Darreld Mclean, MD    Family History Family History  Problem Relation Age of Onset  . Kidney disease Mother   . Heart disease Maternal Grandfather     Social History Social History   Tobacco Use  . Smoking status: Never Smoker  . Smokeless tobacco: Never Used  Substance Use Topics  . Alcohol use: Not Currently  . Drug use: Never     Allergies   Patient has no known allergies.   Review of Systems Review of Systems ROS: Statement: All systems negative except as marked or noted in the HPI; Constitutional: Negative for fever and chills. ; ; Eyes: Negative for eye pain, redness and discharge. ; ; ENMT: Negative for ear pain,  hoarseness, nasal congestion, sinus pressure and sore throat. ; ; Cardiovascular: Negative for chest pain, palpitations, diaphoresis, dyspnea and peripheral edema. ; ; Respiratory: Negative for cough, wheezing and stridor. ; ; Gastrointestinal: Negative for nausea, vomiting, diarrhea, abdominal pain, blood in stool, hematemesis, jaundice and rectal bleeding. . ; ; Genitourinary: Negative for dysuria, flank pain and hematuria. ; ; Musculoskeletal: +right shoulder pain. Negative for back pain and neck pain. Negative for swelling and trauma.; ; Skin: Negative for pruritus, rash, abrasions, blisters, bruising and skin lesion.; ; Neuro: Negative for headache, lightheadedness and neck stiffness. Negative for weakness, altered level of consciousness, altered mental status, extremity weakness, paresthesias, involuntary movement, seizure and syncope.       Physical Exam Updated Vital Signs BP (!) 211/99   Pulse 73   Temp (!) 96.8 F (36 C) (Temporal)   Resp 16   Ht 5\' 4"  (1.626 m)   Wt 68 kg   SpO2 100%   BMI 25.75 kg/m   BP (!) 175/112   Pulse 72   Temp (!) 96.8 F (36 C) (Temporal)   Resp 16   Ht 5\' 4"  (1.626 m)   Wt 68 kg   SpO2 100%   BMI 25.75 kg/m   194/98, 197/94  Physical Exam 1445: Physical examination:  Nursing notes reviewed; Vital signs and O2 SAT reviewed;  Constitutional: Well developed, Well nourished, Well hydrated, Crying, sitting left side folded into fetal position.; Head:  Normocephalic, atraumatic; Eyes: EOMI, PERRL, No scleral icterus; ENMT: Mouth and pharynx normal, Mucous membranes moist; Neck: Supple, Full range of motion, No lymphadenopathy; Cardiovascular: Regular rate and rhythm, No gallop; Respiratory: Breath sounds clear & equal bilaterally, No wheezes.  Speaking full sentences with ease, Normal respiratory effort/excursion; Chest: Nontender, Movement normal; Abdomen: Soft, Nontender, Nondistended, Normal bowel sounds; Genitourinary: No CVA tenderness; Spine:  No  midline CS, TS, LS tenderness.;; Extremities: Peripheral pulses normal, +generalized right shoulder tenderness, known palp cyst proximal shoulder area, decreased ROM right shoulder due to pain. NT right elbow/wrist/hand. Strong radial pulse, muscles compartments soft. No edema, No calf edema or asymmetry.; Neuro: AA&Ox3, No facial droop. Speech clear. No gross focal motor deficits in extremities.; Skin: Color normal, Warm, Dry.   ED Treatments / Results  Labs (all labs ordered are listed, but only abnormal results are displayed)   EKG None  Radiology   Procedures Procedures (including critical care time)  Medications Ordered in ED Medications  morphine 4 MG/ML injection 4 mg (4 mg Intramuscular Given 04/03/18 1453)  HYDROmorphone (DILAUDID) injection 2 mg (2 mg Intramuscular  Given 04/03/18 1534)     Initial Impression / Assessment and Plan / ED Course  I have reviewed the triage vital signs and the nursing notes.  Pertinent labs & imaging results that were available during my care of the patient were reviewed by me and considered in my medical decision making (see chart for details).  MDM Reviewed: previous chart, nursing note and vitals Reviewed previous: MRI Interpretation: x-ray   Dg Shoulder Right Result Date: 04/03/2018 CLINICAL DATA:  Severe shoulder pain. EXAM: RIGHT SHOULDER - 2+ VIEW COMPARISON:  01/19/2018 right shoulder radiographs and 02/15/2018 shoulder MRI FINDINGS: Suture anchors are again noted in the humeral head related to prior rotator cuff repair. No acute fracture is identified. There is new inferior subluxation of the humeral head with widened acromiohumeral interval of 2 cm. A 6.5 x 4.3 cm masslike density in the superficial soft tissues overlying the acromion is larger than the cyst shown on the prior MRI. IMPRESSION: 1. New inferior subluxation of the humeral head. 2. 6.5 cm mass superficial to the acromion presumably reflecting enlargement of the previously  shown cyst. 3. No acute fracture. Electronically Signed   By: Sebastian Ache M.D.   On: 04/03/2018 13:53    1445:  Pt not wearing her sling. Sling applied, pain meds ordered.   1705:  T/C returned from Ortho Dr. August Saucer, case discussed, including:  HPI, pertinent PM/SHx, VS/PE, dx testing, ED course and treatment:  Agreeable with ED treatment, pt will need surgery on her right shoulder (cyst removal, and have her think about possible replacement), have pt continue to wear sling, pain meds prn, f/u office. Dx and testing, as well as d/w Ortho MD, d/w pt and family.  Questions answered.  Verb understanding, agreeable to d/c home with outpt f/u.     Final Clinical Impressions(s) / ED Diagnoses   Final diagnoses:  None    ED Discharge Orders    None       Samuel Jester, DO 04/07/18 2038

## 2018-04-03 NOTE — ED Notes (Signed)
Dr. Clarene Duke made aware of hypertension.

## 2018-04-03 NOTE — Telephone Encounter (Signed)
Veronica Stark will call.  Not entirely certain that taking that cyst out is going to be predictably helpful but I think it is a good first step.  If not then we will need to consider reverse shoulder replacement.  Pain medicine use before and after surgery also could be an issue.

## 2018-04-03 NOTE — Telephone Encounter (Signed)
noted 

## 2018-04-03 NOTE — Telephone Encounter (Signed)
Received call from Veronica Stark, cousin of patient whom she lives with. Patient is in excruciating pain today. She denies that she has done anything to her shoulder.  She has used her sling, ice, and taken 3 hydrocodone pills since 7am this morning.  They are unsure what to do for pain, but states that the patient cannot stand it.  I advised Dr. August Saucer is in surgery and scheduled to come in to clinic this afternoon.  Aggie Cosier states that she was going to take patient to ED in [place] and see if they could give her a shot or something and wondered if they would do that.  I explained that if patient had taken 3 hydrocodone and still could not get pain relief, she may need to have her evaluated in ED as we would not have a shot available in the office to help with pain relief. I did explain she would be examined by a physician there who would decide what she needed.  She is going to go ahead and take her to [facility] ED for evaluation.  Aggie Cosier states that they would also like to know date and details for surgery that is to be scheduled.  Eunice Blase, could you please call to advise?  [phone] CB

## 2018-04-03 NOTE — Discharge Instructions (Addendum)
Take your usual prescriptions as previously directed. Continue to wear your sling as previously instructed. Apply moist heat or ice to the area(s) of discomfort, for 15 minutes at a time, several times per day for the next few days.  Do not fall asleep on a heating or ice pack.  Call your regular medical doctor and your Orthopedist tomorrow to schedule a follow up appointment this week.  Return to the Emergency Department immediately if worsening.

## 2018-04-10 ENCOUNTER — Ambulatory Visit (INDEPENDENT_AMBULATORY_CARE_PROVIDER_SITE_OTHER): Payer: Medicare Other | Admitting: Orthopedic Surgery

## 2018-04-10 DIAGNOSIS — M71311 Other bursal cyst, right shoulder: Secondary | ICD-10-CM | POA: Diagnosis not present

## 2018-04-12 ENCOUNTER — Ambulatory Visit: Payer: Medicare Other | Admitting: Gastroenterology

## 2018-04-17 ENCOUNTER — Inpatient Hospital Stay (INDEPENDENT_AMBULATORY_CARE_PROVIDER_SITE_OTHER): Payer: Medicare Other | Admitting: Orthopedic Surgery

## 2018-04-19 ENCOUNTER — Encounter (INDEPENDENT_AMBULATORY_CARE_PROVIDER_SITE_OTHER): Payer: Self-pay | Admitting: Orthopedic Surgery

## 2018-04-19 ENCOUNTER — Ambulatory Visit (INDEPENDENT_AMBULATORY_CARE_PROVIDER_SITE_OTHER): Payer: Medicare Other | Admitting: Orthopedic Surgery

## 2018-04-19 DIAGNOSIS — M12811 Other specific arthropathies, not elsewhere classified, right shoulder: Secondary | ICD-10-CM

## 2018-04-19 MED ORDER — OXYCODONE-ACETAMINOPHEN 5-325 MG PO TABS: 1.0000 | ORAL_TABLET | Freq: Four times a day (QID) | ORAL | 0 refills | Status: AC | PRN

## 2018-04-19 MED ORDER — METHOCARBAMOL 500 MG PO TABS: 500.0000 mg | ORAL_TABLET | Freq: Two times a day (BID) | ORAL | 0 refills | Status: DC | PRN

## 2018-04-21 ENCOUNTER — Encounter (INDEPENDENT_AMBULATORY_CARE_PROVIDER_SITE_OTHER): Payer: Self-pay | Admitting: Orthopedic Surgery

## 2018-04-21 NOTE — Progress Notes (Signed)
   Post-Op Visit Note   Patient: Veronica Stark           Date of Birth: [birth year]           MRN: [id number] Visit Date: 04/19/2018 PCP: Pearson Grippe, MD   Assessment & Plan:  Chief Complaint:  Chief Complaint  Patient presents with  . Right Shoulder - Routine Post Op   Visit Diagnoses:  1. Rotator cuff arthropathy of right shoulder     Plan: [name] is a patient is now postop right shoulder cyst excision.  On exam she does have some swelling around the acromion but the cyst itself is been decompressed.  Come in a refill oxycodone and Robaxin.  She is going to be starting Enbrel next week.  She would like to get referred to pain management because she has been on pain medicine a long time.  See her back in about 2 weeks we may consider aspiration of any residual fluid collection that is present.  Follow-Up Instructions: Return in about 2 weeks (around 05/03/2018).   Orders:  Orders Placed This Encounter  Procedures  . Ambulatory referral to Physical Medicine Rehab   Meds ordered this encounter  Medications  . oxyCODONE-acetaminophen (PERCOCET/ROXICET) 5-325 MG tablet    Sig: Take 1 tablet by mouth every 6 (six) hours as needed for severe pain.    Dispense:  30 tablet    Refill:  0  . methocarbamol (ROBAXIN) 500 MG tablet    Sig: Take 1 tablet (500 mg total) by mouth every 12 (twelve) hours as needed for muscle spasms.    Dispense:  30 tablet    Refill:  0    Imaging: No results found.  PMFS History: There are no active problems to display for this patient.  Past Medical History:  Diagnosis Date  . Arthritis   . Asthma   . Chronic right shoulder pain   . Hypertension   . Right shoulder injury 11/2017  . Rotator cuff arthropathy, right   . Rotator cuff tear    right    Family History  Problem Relation Age of Onset  . Kidney disease Mother   . Heart disease Maternal Grandfather     Past Surgical History:  Procedure Laterality Date  . ABDOMINAL HYSTERECTOMY     . CHOLECYSTECTOMY    . ROTATOR CUFF REPAIR Right 2004   Social History   Occupational History  . Not on file  Tobacco Use  . Smoking status: Never Smoker  . Smokeless tobacco: Never Used  Substance and Sexual Activity  . Alcohol use: Not Currently  . Drug use: Never  . Sexual activity: Not on file

## 2018-05-03 ENCOUNTER — Encounter (INDEPENDENT_AMBULATORY_CARE_PROVIDER_SITE_OTHER): Payer: Self-pay | Admitting: Orthopedic Surgery

## 2018-05-03 ENCOUNTER — Ambulatory Visit (INDEPENDENT_AMBULATORY_CARE_PROVIDER_SITE_OTHER): Payer: Medicare Other | Admitting: Orthopedic Surgery

## 2018-05-03 DIAGNOSIS — M12811 Other specific arthropathies, not elsewhere classified, right shoulder: Secondary | ICD-10-CM

## 2018-05-03 MED ORDER — OXYCODONE HCL 5 MG PO TABS: ORAL_TABLET | ORAL | 0 refills | Status: AC

## 2018-05-05 ENCOUNTER — Encounter (INDEPENDENT_AMBULATORY_CARE_PROVIDER_SITE_OTHER): Payer: Self-pay | Admitting: Orthopedic Surgery

## 2018-05-05 NOTE — Progress Notes (Signed)
   Post-Op Visit Note   Patient: Veronica Stark           Date of Birth: 01-26-1946           MRN: [id number] Visit Date: 05/03/2018 PCP: Pearson Grippe, MD   Assessment & Plan:  Chief Complaint:  Chief Complaint  Patient presents with  . Right Shoulder - Follow-up   Visit Diagnoses:  1. Rotator cuff arthropathy of right shoulder     Plan: [name] is a patient who is now about 2 weeks out right shoulder cyst removal.  In general she is doing well.  She has not had a sling for couple of weeks.  The large cyst has been decompressed and she only has a little bit of residual fluid on the superior aspect of the shoulder.  I am going to refer her to pain management and refill her oxycodone x1 and see her back in 8 weeks for final check.  She does have pretty reasonable shoulder function but definitely rotator cuff arthropathy and may need some type of surgery in the future in terms of reverse replacement but for now her function is good.  Decompressing that cyst on the superior aspect of her shoulder I think is helped temporize this problem.  Follow-Up Instructions: Return in about 8 weeks (around 06/28/2018).   Orders:  Orders Placed This Encounter  Procedures  . Ambulatory referral to Pain Clinic   Meds ordered this encounter  Medications  . oxyCODONE (ROXICODONE) 5 MG immediate release tablet    Sig: 1 po bid prn pain    Dispense:  45 tablet    Refill:  0    Imaging: No results found.  PMFS History: There are no active problems to display for this patient.  Past Medical History:  Diagnosis Date  . Arthritis   . Asthma   . Chronic right shoulder pain   . Hypertension   . Right shoulder injury 11/2017  . Rotator cuff arthropathy, right   . Rotator cuff tear    right    Family History  Problem Relation Age of Onset  . Kidney disease Mother   . Heart disease Maternal Grandfather     Past Surgical History:  Procedure Laterality Date  . ABDOMINAL HYSTERECTOMY    .  CHOLECYSTECTOMY    . ROTATOR CUFF REPAIR Right 2004   Social History   Occupational History  . Not on file  Tobacco Use  . Smoking status: Never Smoker  . Smokeless tobacco: Never Used  Substance and Sexual Activity  . Alcohol use: Not Currently  . Drug use: Never  . Sexual activity: Not on file

## 2018-05-13 ENCOUNTER — Other Ambulatory Visit (INDEPENDENT_AMBULATORY_CARE_PROVIDER_SITE_OTHER): Payer: Self-pay | Admitting: Orthopedic Surgery

## 2018-05-27 ENCOUNTER — Other Ambulatory Visit (INDEPENDENT_AMBULATORY_CARE_PROVIDER_SITE_OTHER): Payer: Self-pay | Admitting: Orthopedic Surgery

## 2018-05-29 NOTE — Telephone Encounter (Signed)
y

## 2018-05-29 NOTE — Telephone Encounter (Signed)
Ok to rf? 

## 2018-06-13 ENCOUNTER — Ambulatory Visit: Payer: Medicare Other | Admitting: Internal Medicine

## 2018-06-14 ENCOUNTER — Ambulatory Visit: Payer: Medicare Other | Admitting: Internal Medicine

## 2018-07-06 ENCOUNTER — Ambulatory Visit (INDEPENDENT_AMBULATORY_CARE_PROVIDER_SITE_OTHER): Payer: Medicare Other | Admitting: Orthopedic Surgery

## 2018-07-06 ENCOUNTER — Other Ambulatory Visit: Payer: Self-pay

## 2018-07-06 ENCOUNTER — Encounter (INDEPENDENT_AMBULATORY_CARE_PROVIDER_SITE_OTHER): Payer: Self-pay | Admitting: Orthopedic Surgery

## 2018-07-06 DIAGNOSIS — M12811 Other specific arthropathies, not elsewhere classified, right shoulder: Secondary | ICD-10-CM

## 2018-07-06 NOTE — Progress Notes (Signed)
   Post-Op Visit Note   Patient: Veronica Stark           Date of Birth: [birth year]           MRN: [id number] Visit Date: 07/06/2018 PCP: Pearson Grippe, MD   Assessment & Plan:  Chief Complaint:  Chief Complaint  Patient presents with  . Right Shoulder - Follow-up   Visit Diagnoses: No diagnosis found.  Plan: Patient presents now 2 months out right shoulder cyst decompression from rotator cuff arthropathy.  In general she is doing much better.  She has an appointment with pain management June 9.  She is functional with that right arm.  On exam she has forward flexion abduction both above 90 degrees.  Incision is well-healed.  No evidence of infection or recurrent swelling particularly superiorly.  Plan at this time is to let her go.  I will see her back as needed.  She may need reverse replacement in the future but right now she is very functional and has good pain relief from the other procedure.  Follow-Up Instructions: No follow-ups on file.   Orders:  No orders of the defined types were placed in this encounter.  No orders of the defined types were placed in this encounter.   Imaging: No results found.  PMFS History: There are no active problems to display for this patient.  Past Medical History:  Diagnosis Date  . Arthritis   . Asthma   . Chronic right shoulder pain   . Hypertension   . Right shoulder injury 11/2017  . Rotator cuff arthropathy, right   . Rotator cuff tear    right    Family History  Problem Relation Age of Onset  . Kidney disease Mother   . Heart disease Maternal Grandfather     Past Surgical History:  Procedure Laterality Date  . ABDOMINAL HYSTERECTOMY    . CHOLECYSTECTOMY    . ROTATOR CUFF REPAIR Right 2004   Social History   Occupational History  . Not on file  Tobacco Use  . Smoking status: Never Smoker  . Smokeless tobacco: Never Used  Substance and Sexual Activity  . Alcohol use: Not Currently  . Drug use: Never  . Sexual  activity: Not on file

## 2020-03-14 IMAGING — DX DG SHOULDER 2+V*R*
3 series · 3 of 3 positions shown · non-contrast
Comparison: None.

CLINICAL DATA: Right shoulder pain.

EXAM:
RIGHT SHOULDER - 2+ VIEW

[shoulder grashey]
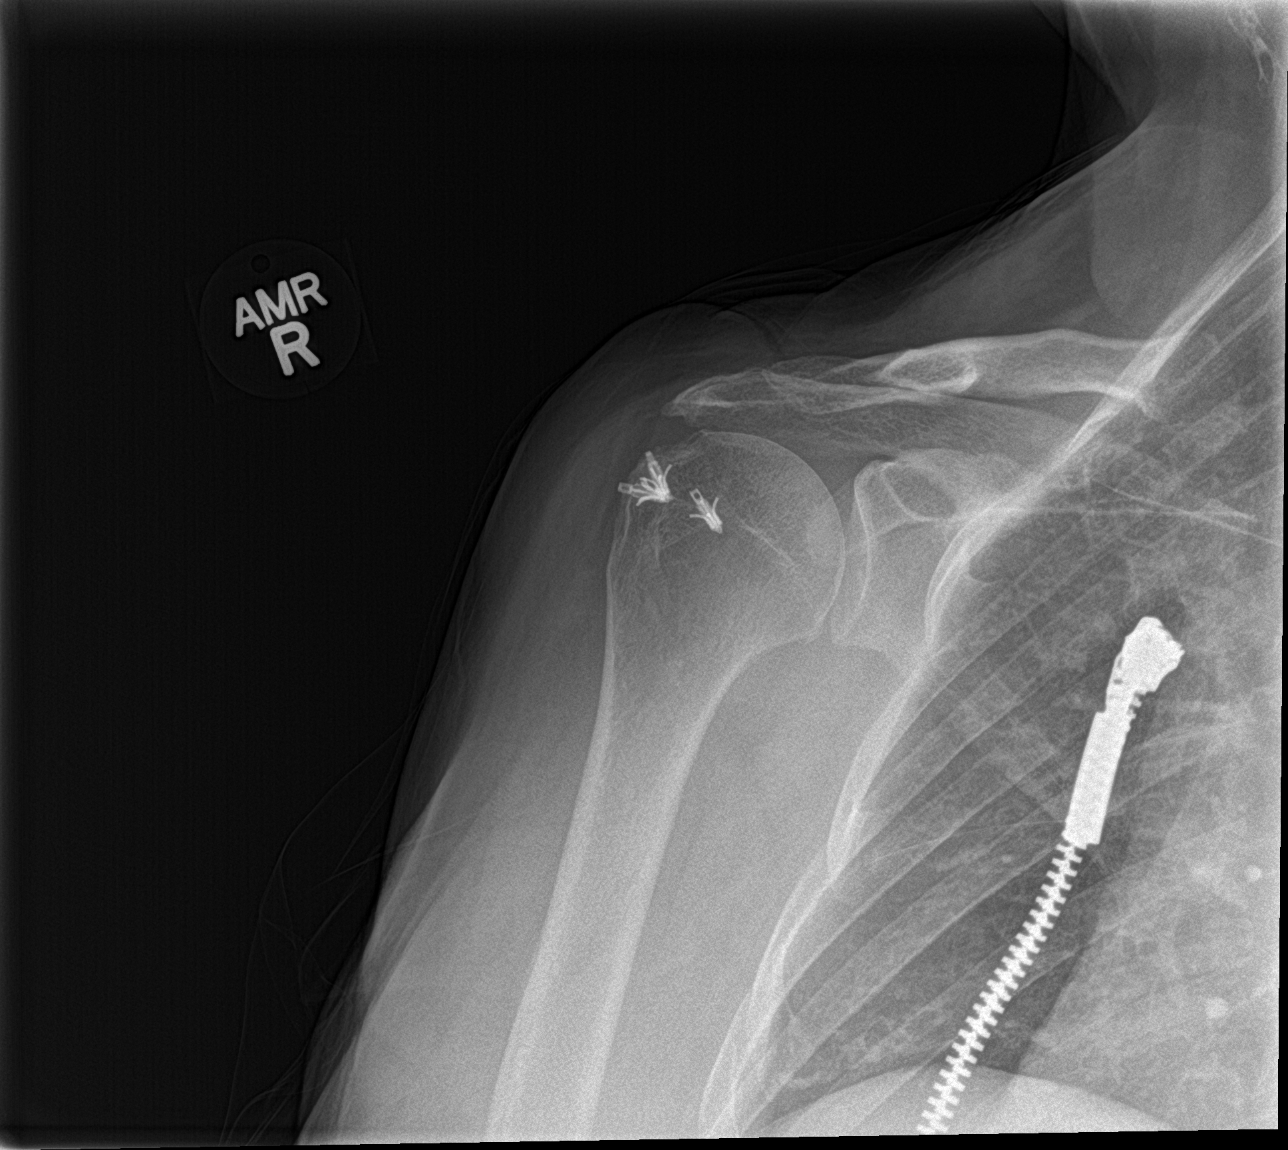

[shoulder y view]
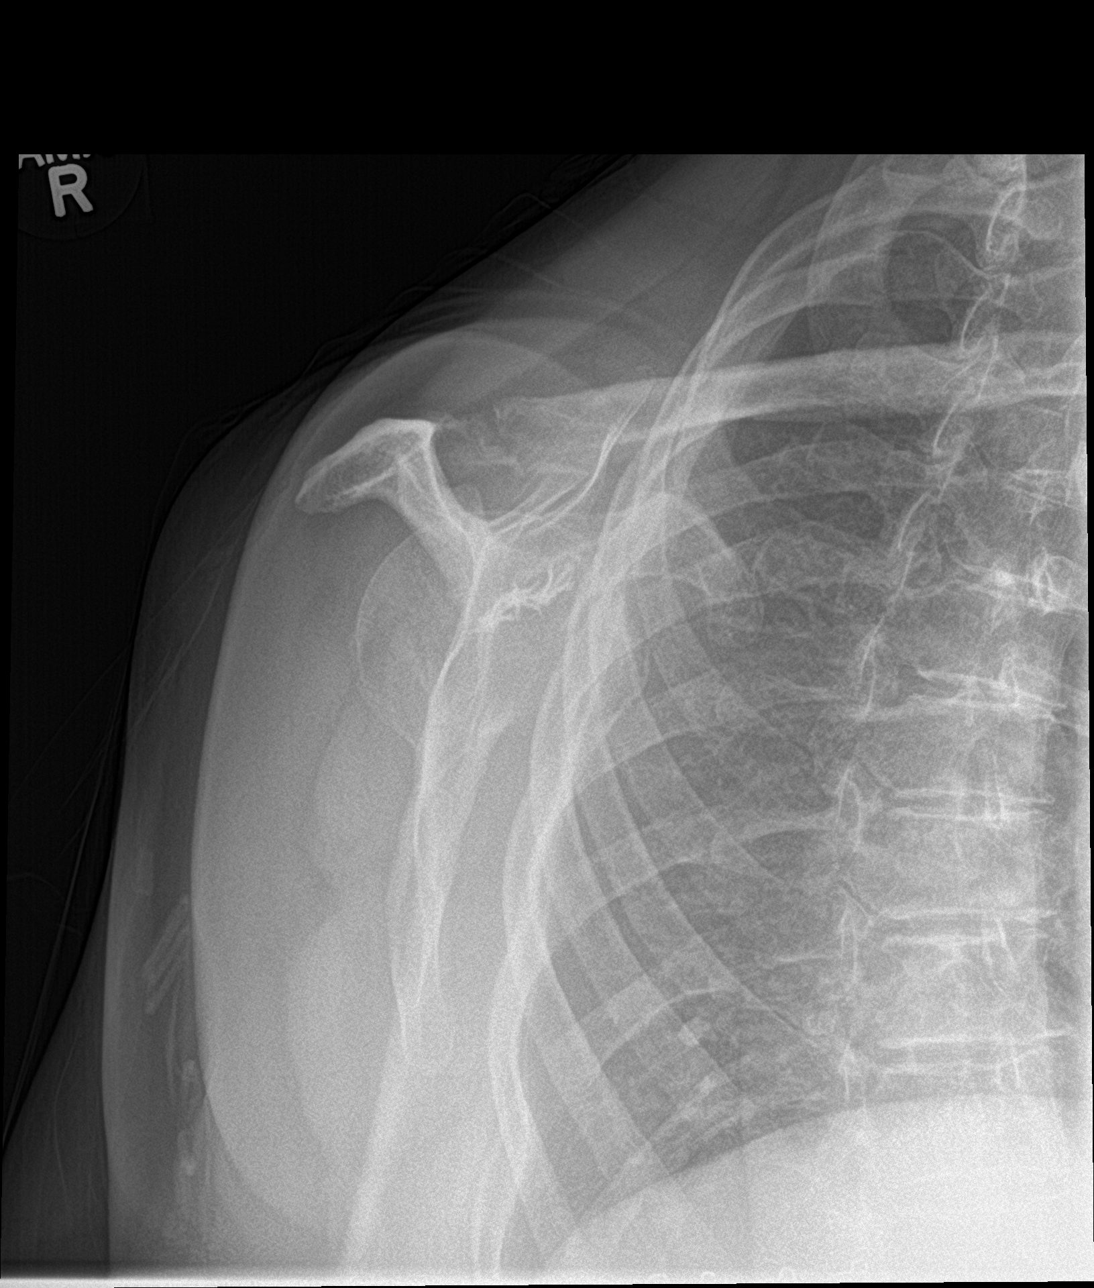

[shoulder axillary]
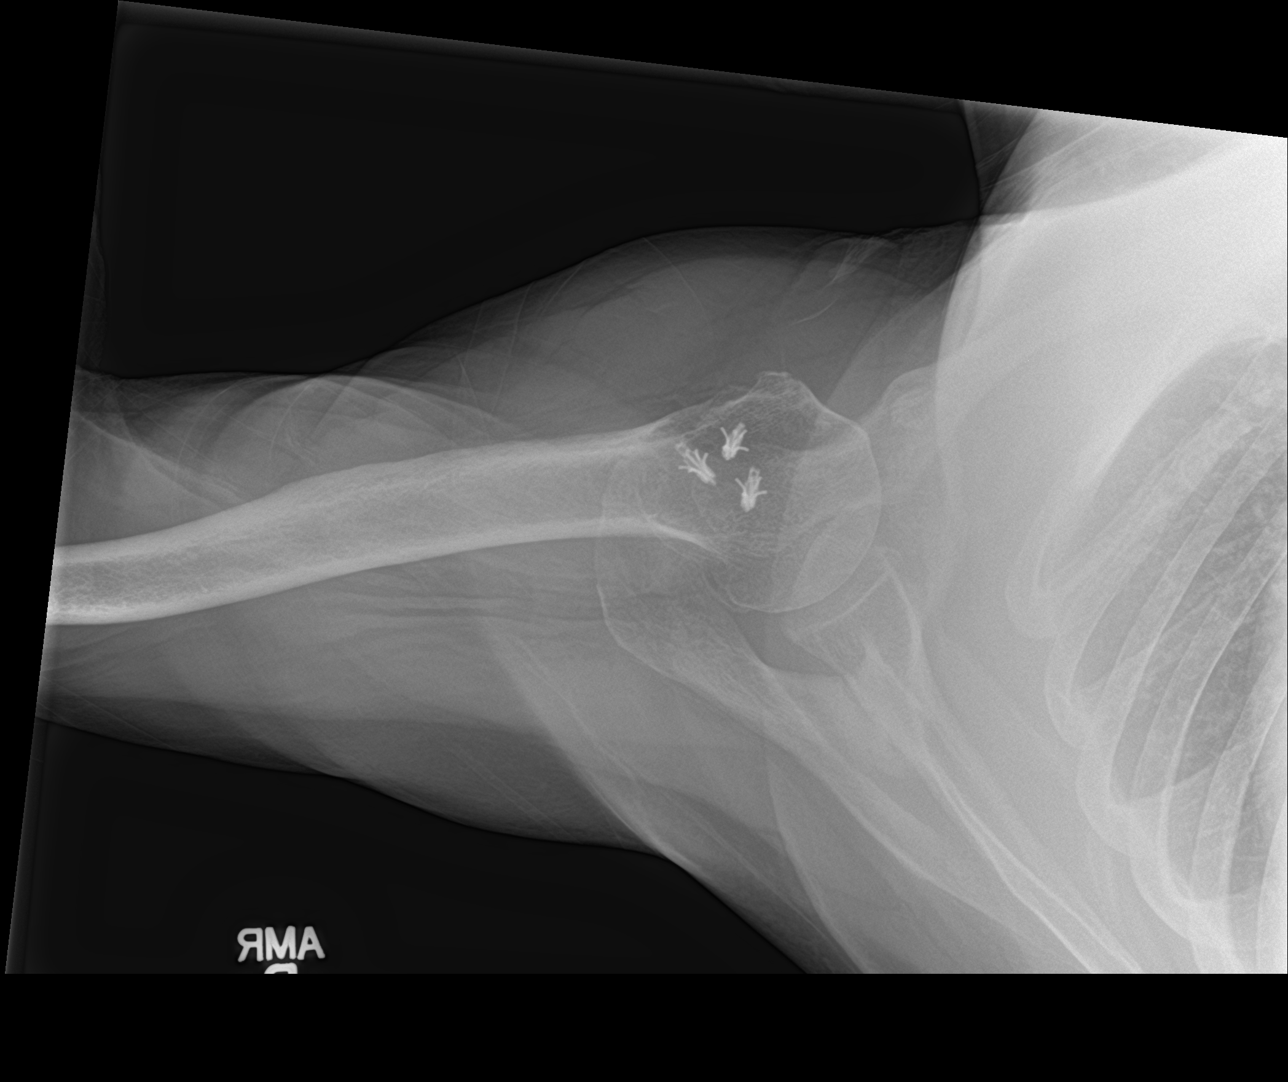

[3 of 3 positions shown; findings below may reference images not displayed]

FINDINGS: No acute fracture or dislocation. Mild acromioclavicular
degenerative changes. Tiny inferior glenohumeral osteophytes with
preserved joint space. Suture anchors in the greater tuberosity from
prior rotator cuff repair. Osteopenia. Soft tissues are
unremarkable.
IMPRESSION: 1.  No acute osseous abnormality.
2. Mild acromioclavicular osteoarthritis.

## 2020-05-27 IMAGING — DX DG SHOULDER 2+V*R*
2 series · 2 of 2 positions shown · non-contrast
Comparison: 01/19/2018 right shoulder radiographs and 02/15/2018
shoulder MRI

CLINICAL DATA: Severe shoulder pain.

EXAM:
RIGHT SHOULDER - 2+ VIEW

[shoulder ap]
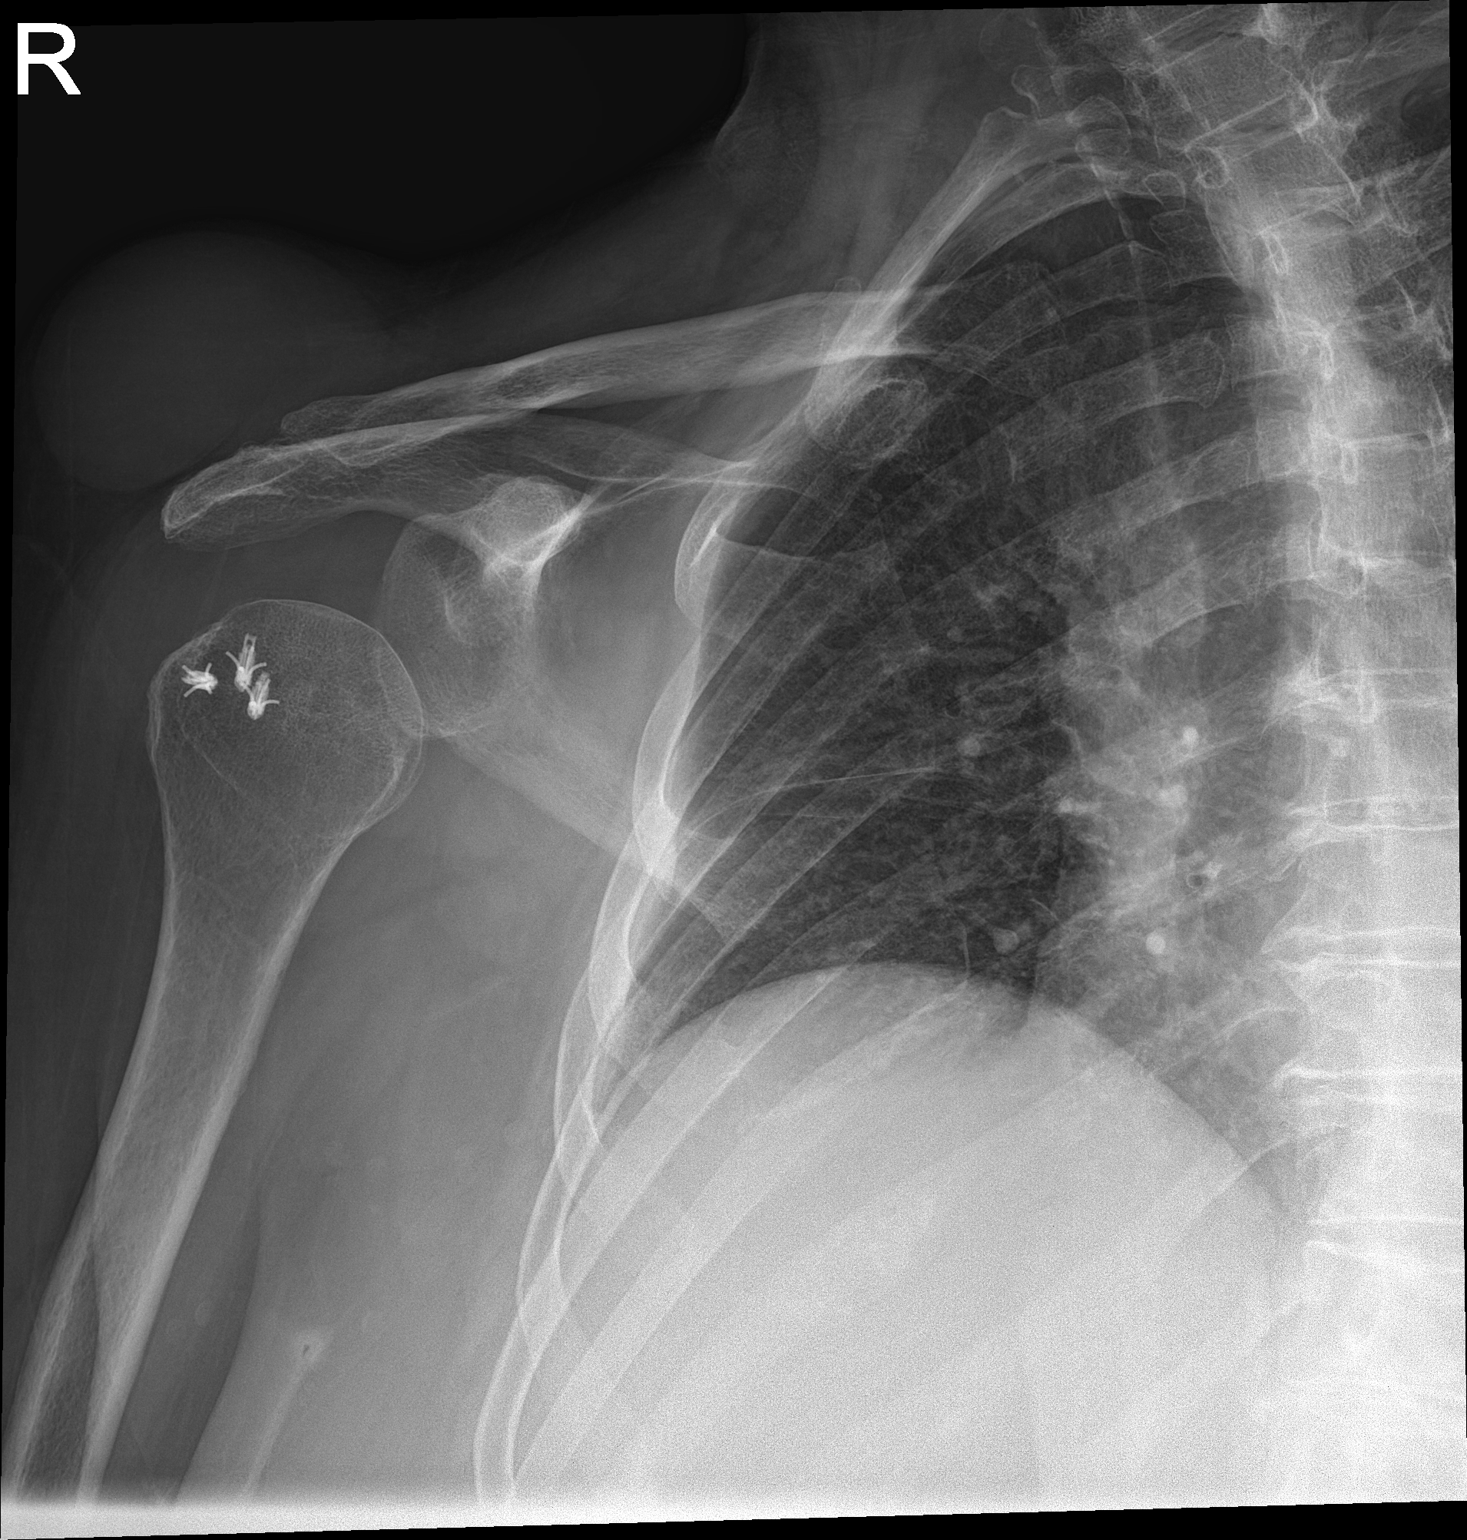

[shoulder y view]
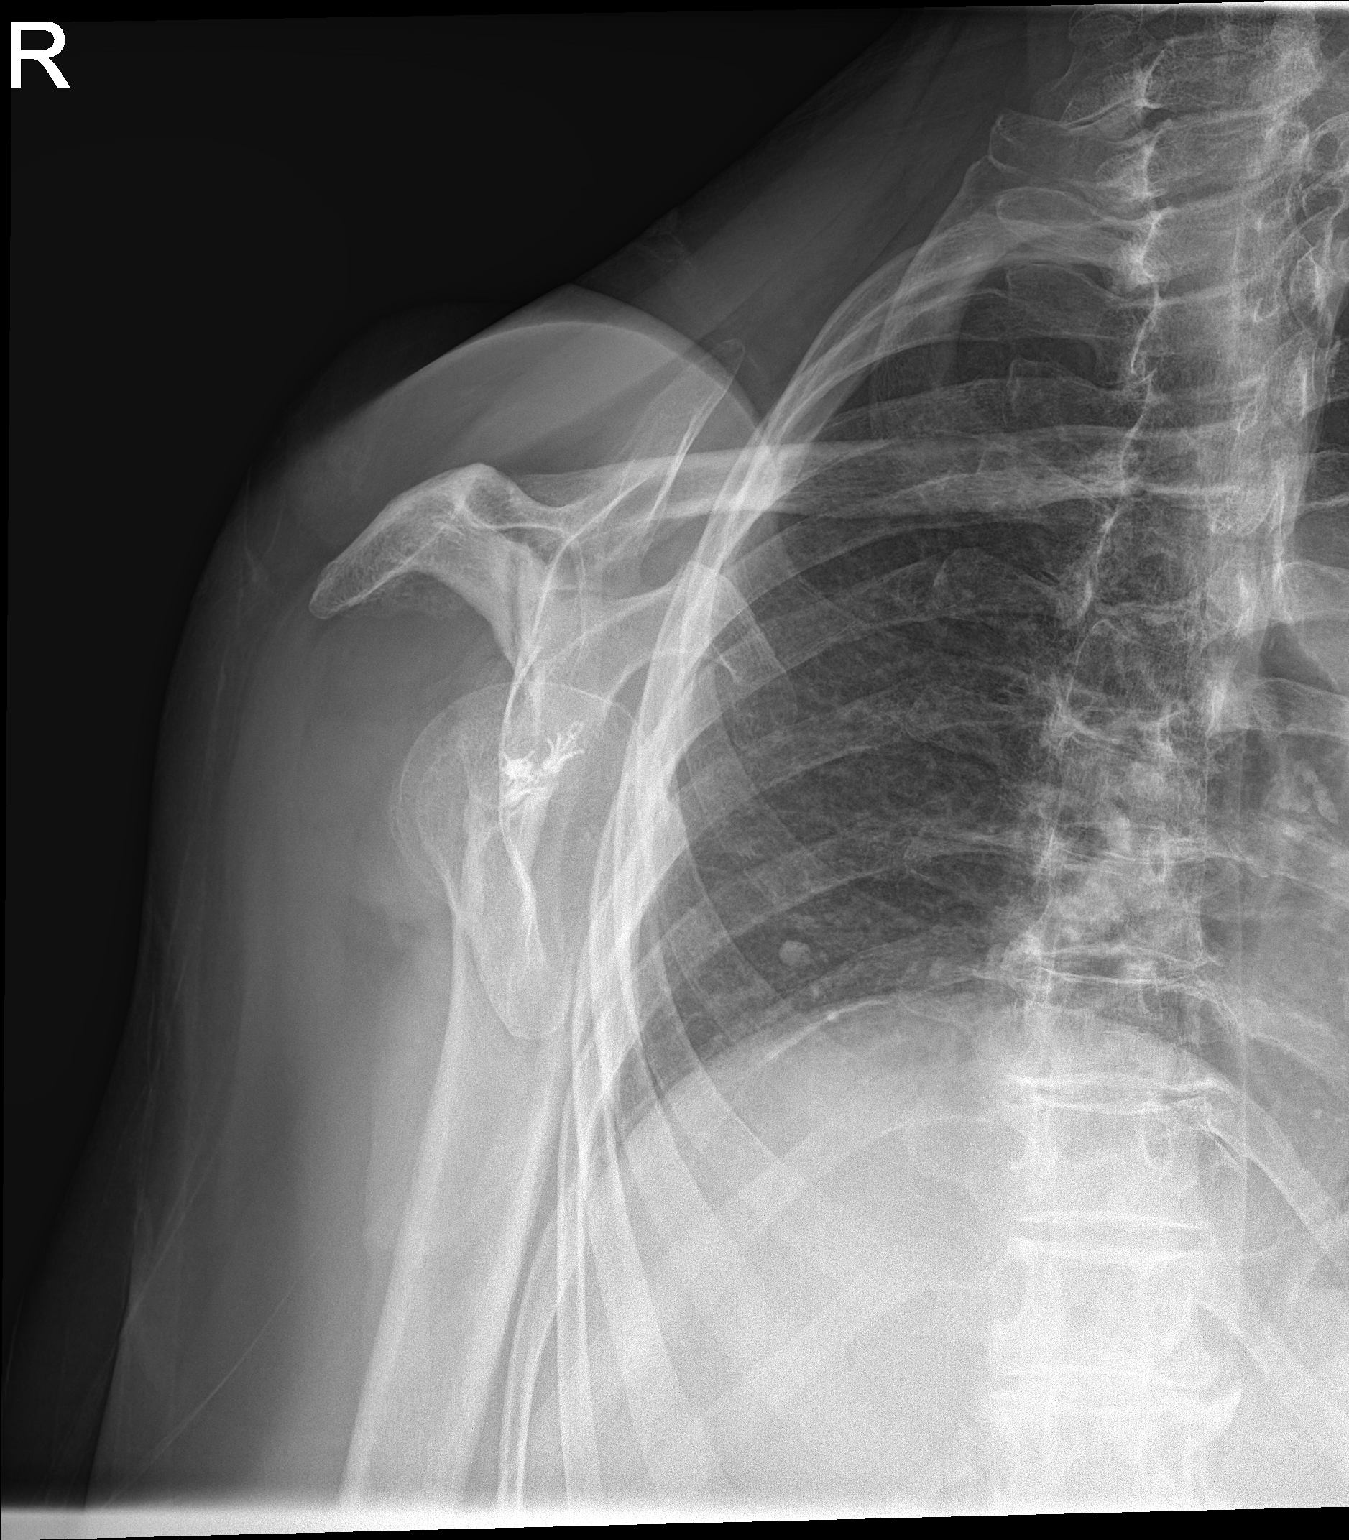

[2 of 2 positions shown; findings below may reference images not displayed]

FINDINGS: Suture anchors are again noted in the humeral head related to prior
rotator cuff repair. No acute fracture is identified. There is new
inferior subluxation of the humeral head with widened acromiohumeral
interval of 2 cm. A 6.5 x 4.3 cm masslike density in the superficial
soft tissues overlying the acromion is larger than the cyst shown on
the prior MRI.
IMPRESSION: 1. New inferior subluxation of the humeral head.
2. 6.5 cm mass superficial to the acromion presumably reflecting
enlargement of the previously shown cyst.
3. No acute fracture.
